# Patient Record
Sex: Female | Born: 1950 | Race: Black or African American | Hispanic: No | Marital: Married | Smoking: Never smoker
Health system: Southern US, Community
[De-identification: ages and names within clinical notes are randomized; demographics above are authoritative.]

## PROBLEM LIST (undated history)

## (undated) HISTORY — PX: BREAST BIOPSY: SHX20

---

## 2015-07-22 ENCOUNTER — Other Ambulatory Visit: Payer: Self-pay | Admitting: Nephrology

## 2015-07-22 DIAGNOSIS — N183 Chronic kidney disease, stage 3 unspecified: Secondary | ICD-10-CM

## 2015-07-25 ENCOUNTER — Other Ambulatory Visit: Payer: Self-pay

## 2015-08-08 ENCOUNTER — Ambulatory Visit
Admission: RE | Admit: 2015-08-08 | Discharge: 2015-08-08 | Disposition: A | Discharge status: Home / Self Care | Payer: BC Managed Care – PPO | Source: Ambulatory Visit | Attending: Nephrology | Admitting: Nephrology

## 2015-08-08 DIAGNOSIS — N183 Chronic kidney disease, stage 3 unspecified: Secondary | ICD-10-CM

## 2015-08-19 ENCOUNTER — Other Ambulatory Visit: Payer: Self-pay | Admitting: Nurse Practitioner

## 2015-08-19 DIAGNOSIS — N644 Mastodynia: Secondary | ICD-10-CM

## 2015-08-29 ENCOUNTER — Other Ambulatory Visit: Payer: BC Managed Care – PPO

## 2015-09-15 ENCOUNTER — Other Ambulatory Visit: Payer: BC Managed Care – PPO

## 2015-09-15 ENCOUNTER — Inpatient Hospital Stay: Admission: RE | Admit: 2015-09-15 | Payer: BC Managed Care – PPO | Source: Ambulatory Visit

## 2016-04-08 ENCOUNTER — Other Ambulatory Visit: Payer: Self-pay | Admitting: Family Medicine

## 2016-04-08 DIAGNOSIS — Z1231 Encounter for screening mammogram for malignant neoplasm of breast: Secondary | ICD-10-CM

## 2016-04-30 ENCOUNTER — Ambulatory Visit: Payer: BC Managed Care – PPO

## 2016-06-04 ENCOUNTER — Ambulatory Visit
Admission: RE | Admit: 2016-06-04 | Discharge: 2016-06-04 | Disposition: A | Discharge status: Home / Self Care | Payer: BC Managed Care – PPO | Source: Ambulatory Visit | Attending: Family Medicine | Admitting: Family Medicine

## 2016-06-04 DIAGNOSIS — Z1231 Encounter for screening mammogram for malignant neoplasm of breast: Secondary | ICD-10-CM

## 2016-09-21 DIAGNOSIS — M545 Low back pain: Secondary | ICD-10-CM | POA: Diagnosis not present

## 2016-09-21 DIAGNOSIS — N183 Chronic kidney disease, stage 3 (moderate): Secondary | ICD-10-CM | POA: Diagnosis not present

## 2016-09-21 DIAGNOSIS — I129 Hypertensive chronic kidney disease with stage 1 through stage 4 chronic kidney disease, or unspecified chronic kidney disease: Secondary | ICD-10-CM | POA: Diagnosis not present

## 2016-09-21 DIAGNOSIS — D696 Thrombocytopenia, unspecified: Secondary | ICD-10-CM | POA: Diagnosis not present

## 2016-09-21 DIAGNOSIS — E7800 Pure hypercholesterolemia, unspecified: Secondary | ICD-10-CM | POA: Diagnosis not present

## 2016-09-21 DIAGNOSIS — E669 Obesity, unspecified: Secondary | ICD-10-CM | POA: Diagnosis not present

## 2017-02-02 DIAGNOSIS — Z23 Encounter for immunization: Secondary | ICD-10-CM | POA: Diagnosis not present

## 2017-03-14 DIAGNOSIS — Z7189 Other specified counseling: Secondary | ICD-10-CM | POA: Diagnosis not present

## 2017-03-14 DIAGNOSIS — Z136 Encounter for screening for cardiovascular disorders: Secondary | ICD-10-CM | POA: Diagnosis not present

## 2017-03-14 DIAGNOSIS — Z1231 Encounter for screening mammogram for malignant neoplasm of breast: Secondary | ICD-10-CM | POA: Diagnosis not present

## 2017-03-14 DIAGNOSIS — Z6829 Body mass index (BMI) 29.0-29.9, adult: Secondary | ICD-10-CM | POA: Diagnosis not present

## 2017-03-14 DIAGNOSIS — E663 Overweight: Secondary | ICD-10-CM | POA: Diagnosis not present

## 2017-03-14 DIAGNOSIS — Z0000 Encounter for general adult medical examination without abnormal findings: Secondary | ICD-10-CM | POA: Diagnosis not present

## 2017-03-14 DIAGNOSIS — R9431 Abnormal electrocardiogram [ECG] [EKG]: Secondary | ICD-10-CM | POA: Diagnosis not present

## 2017-03-14 DIAGNOSIS — D696 Thrombocytopenia, unspecified: Secondary | ICD-10-CM | POA: Diagnosis not present

## 2017-03-14 DIAGNOSIS — Z1211 Encounter for screening for malignant neoplasm of colon: Secondary | ICD-10-CM | POA: Diagnosis not present

## 2017-06-14 DIAGNOSIS — Z6829 Body mass index (BMI) 29.0-29.9, adult: Secondary | ICD-10-CM | POA: Diagnosis not present

## 2017-06-14 DIAGNOSIS — I10 Essential (primary) hypertension: Secondary | ICD-10-CM | POA: Diagnosis not present

## 2017-06-14 DIAGNOSIS — Z9181 History of falling: Secondary | ICD-10-CM | POA: Diagnosis not present

## 2017-06-14 DIAGNOSIS — Z1331 Encounter for screening for depression: Secondary | ICD-10-CM | POA: Diagnosis not present

## 2017-06-14 DIAGNOSIS — L84 Corns and callosities: Secondary | ICD-10-CM | POA: Diagnosis not present

## 2017-06-14 DIAGNOSIS — M25511 Pain in right shoulder: Secondary | ICD-10-CM | POA: Diagnosis not present

## 2017-09-27 DIAGNOSIS — H2513 Age-related nuclear cataract, bilateral: Secondary | ICD-10-CM | POA: Diagnosis not present

## 2017-10-25 DIAGNOSIS — E669 Obesity, unspecified: Secondary | ICD-10-CM | POA: Diagnosis not present

## 2017-10-25 DIAGNOSIS — N183 Chronic kidney disease, stage 3 (moderate): Secondary | ICD-10-CM | POA: Diagnosis not present

## 2017-10-25 DIAGNOSIS — E7800 Pure hypercholesterolemia, unspecified: Secondary | ICD-10-CM | POA: Diagnosis not present

## 2017-10-25 DIAGNOSIS — I129 Hypertensive chronic kidney disease with stage 1 through stage 4 chronic kidney disease, or unspecified chronic kidney disease: Secondary | ICD-10-CM | POA: Diagnosis not present

## 2018-02-02 DIAGNOSIS — Z23 Encounter for immunization: Secondary | ICD-10-CM | POA: Diagnosis not present

## 2018-02-07 DIAGNOSIS — Z111 Encounter for screening for respiratory tuberculosis: Secondary | ICD-10-CM | POA: Diagnosis not present

## 2018-03-20 DIAGNOSIS — Z1239 Encounter for other screening for malignant neoplasm of breast: Secondary | ICD-10-CM | POA: Diagnosis not present

## 2018-03-20 DIAGNOSIS — M79675 Pain in left toe(s): Secondary | ICD-10-CM | POA: Diagnosis not present

## 2018-03-20 DIAGNOSIS — Z9181 History of falling: Secondary | ICD-10-CM | POA: Diagnosis not present

## 2018-03-20 DIAGNOSIS — Z136 Encounter for screening for cardiovascular disorders: Secondary | ICD-10-CM | POA: Diagnosis not present

## 2018-03-20 DIAGNOSIS — Z0000 Encounter for general adult medical examination without abnormal findings: Secondary | ICD-10-CM | POA: Diagnosis not present

## 2018-03-20 DIAGNOSIS — R202 Paresthesia of skin: Secondary | ICD-10-CM | POA: Diagnosis not present

## 2018-03-20 DIAGNOSIS — Z1339 Encounter for screening examination for other mental health and behavioral disorders: Secondary | ICD-10-CM | POA: Diagnosis not present

## 2018-03-20 DIAGNOSIS — Z139 Encounter for screening, unspecified: Secondary | ICD-10-CM | POA: Diagnosis not present

## 2018-03-20 DIAGNOSIS — E785 Hyperlipidemia, unspecified: Secondary | ICD-10-CM | POA: Diagnosis not present

## 2018-03-20 DIAGNOSIS — D696 Thrombocytopenia, unspecified: Secondary | ICD-10-CM | POA: Diagnosis not present

## 2018-03-22 ENCOUNTER — Other Ambulatory Visit: Payer: Self-pay | Admitting: Nurse Practitioner

## 2018-03-22 DIAGNOSIS — Z1231 Encounter for screening mammogram for malignant neoplasm of breast: Secondary | ICD-10-CM

## 2018-05-08 ENCOUNTER — Encounter: Payer: Self-pay | Admitting: Radiology

## 2018-05-08 ENCOUNTER — Ambulatory Visit
Admission: RE | Admit: 2018-05-08 | Discharge: 2018-05-08 | Disposition: A | Discharge status: Home / Self Care | Payer: BC Managed Care – PPO | Source: Ambulatory Visit | Attending: Nurse Practitioner | Admitting: Nurse Practitioner

## 2018-05-08 DIAGNOSIS — Z1231 Encounter for screening mammogram for malignant neoplasm of breast: Secondary | ICD-10-CM

## 2018-10-13 DIAGNOSIS — I129 Hypertensive chronic kidney disease with stage 1 through stage 4 chronic kidney disease, or unspecified chronic kidney disease: Secondary | ICD-10-CM | POA: Diagnosis not present

## 2018-10-13 DIAGNOSIS — E7800 Pure hypercholesterolemia, unspecified: Secondary | ICD-10-CM | POA: Diagnosis not present

## 2018-10-13 DIAGNOSIS — D696 Thrombocytopenia, unspecified: Secondary | ICD-10-CM | POA: Diagnosis not present

## 2018-10-13 DIAGNOSIS — M545 Low back pain: Secondary | ICD-10-CM | POA: Diagnosis not present

## 2018-10-13 DIAGNOSIS — Z0000 Encounter for general adult medical examination without abnormal findings: Secondary | ICD-10-CM | POA: Diagnosis not present

## 2018-10-13 DIAGNOSIS — N183 Chronic kidney disease, stage 3 (moderate): Secondary | ICD-10-CM | POA: Diagnosis not present

## 2018-10-13 DIAGNOSIS — E669 Obesity, unspecified: Secondary | ICD-10-CM | POA: Diagnosis not present

## 2018-10-18 DIAGNOSIS — D3102 Benign neoplasm of left conjunctiva: Secondary | ICD-10-CM | POA: Diagnosis not present

## 2018-10-18 DIAGNOSIS — H2513 Age-related nuclear cataract, bilateral: Secondary | ICD-10-CM | POA: Diagnosis not present

## 2018-12-04 ENCOUNTER — Encounter (HOSPITAL_COMMUNITY): Payer: Self-pay | Admitting: Obstetrics and Gynecology

## 2018-12-04 ENCOUNTER — Emergency Department (HOSPITAL_COMMUNITY)
Admission: EM | Admit: 2018-12-04 | Discharge: 2018-12-04 | Disposition: A | Discharge status: Home / Self Care | Payer: Medicare HMO | Attending: Emergency Medicine | Admitting: Emergency Medicine

## 2018-12-04 ENCOUNTER — Other Ambulatory Visit: Payer: Self-pay

## 2018-12-04 DIAGNOSIS — Z79899 Other long term (current) drug therapy: Secondary | ICD-10-CM | POA: Insufficient documentation

## 2018-12-04 DIAGNOSIS — M5442 Lumbago with sciatica, left side: Secondary | ICD-10-CM | POA: Diagnosis not present

## 2018-12-04 DIAGNOSIS — R52 Pain, unspecified: Secondary | ICD-10-CM | POA: Diagnosis not present

## 2018-12-04 DIAGNOSIS — R29898 Other symptoms and signs involving the musculoskeletal system: Secondary | ICD-10-CM | POA: Diagnosis not present

## 2018-12-04 DIAGNOSIS — I10 Essential (primary) hypertension: Secondary | ICD-10-CM | POA: Diagnosis not present

## 2018-12-04 DIAGNOSIS — M79605 Pain in left leg: Secondary | ICD-10-CM | POA: Diagnosis not present

## 2018-12-04 DIAGNOSIS — M5432 Sciatica, left side: Secondary | ICD-10-CM

## 2018-12-04 MED ORDER — HYDROMORPHONE HCL 1 MG/ML IJ SOLN
1.0000 mg | Freq: Once | INTRAMUSCULAR | Status: AC
  Administered 2018-12-04: 09:00:00 1 mg via INTRAMUSCULAR
  Filled 2018-12-04: qty 1

## 2018-12-04 MED ORDER — KETOROLAC TROMETHAMINE 60 MG/2ML IM SOLN
15.0000 mg | Freq: Once | INTRAMUSCULAR | Status: AC
  Administered 2018-12-04: 15 mg via INTRAMUSCULAR
  Filled 2018-12-04: qty 2

## 2018-12-04 MED ORDER — DEXAMETHASONE SODIUM PHOSPHATE 10 MG/ML IJ SOLN
10.0000 mg | Freq: Once | INTRAMUSCULAR | Status: AC
  Administered 2018-12-04: 10 mg via INTRAMUSCULAR
  Filled 2018-12-04: qty 1

## 2018-12-04 MED ORDER — METHYLPREDNISOLONE 4 MG PO TBPK: ORAL_TABLET | ORAL | 0 refills | Status: AC

## 2018-12-04 MED ORDER — HYDROCODONE-ACETAMINOPHEN 5-325 MG PO TABS: 1.0000 | ORAL_TABLET | Freq: Four times a day (QID) | ORAL | 0 refills | Status: AC | PRN

## 2018-12-04 NOTE — ED Notes (Signed)
Patient ambulated to the bathroom with assistance. Pt reports pain during ambulation but is able to ambulate. RN explained the medications that are to be given and their functions, as well as the understanding that her leg will not fell 100% after the medication, as the sciatic nerve likely will need time to have the inflammation reduced if this is indeed sciatica. Patient reports understanding of plan of care.  Pt is requesting her husband to come back to her room. Patient's husband reportedly left.

## 2018-12-04 NOTE — ED Triage Notes (Signed)
Pt reports pain in the left leg after walking on a treadmill. Pt reports hip to knee pain and it is painful to ambulate. Pt reports she cannot walk due to the pain. No reported trauma or other injury to leg.  Pt's VSS.

## 2018-12-04 NOTE — ED Notes (Signed)
Patient visiting starts at noon today in the ED. Patient is aware and reports understanding. Pt has spoken to husband using the hospital phone and is able to update her contacts.  Pt has the call bell at bedside and is in no active distress at this time.  Pt reports her pain is an 8 at this time and the pain is starting to ease up.

## 2018-12-04 NOTE — Discharge Instructions (Addendum)
1.  Follow-up with your doctor for recheck in the next 3 to 5 days. 2.  Return to the emergency department if you develop weakness or numbness of the leg. 3.  Start your Medrol Dosepak day after tomorrow.  Take Vicodin 1 to 2 tablets every 6 hours as needed for pain control. 4.  If you have any tendency towards constipation, take a stool softener twice a day such as Colace to avoid getting constipated while taking narcotic pain medications (Vicodin).

## 2018-12-04 NOTE — ED Provider Notes (Signed)
[facility] DEPT Provider Note   CSN: [id number] Arrival date & time: 12/04/18  0807    History   Chief Complaint Chief Complaint  Patient presents with  . Leg Pain    HPI Cynthia Wilkerson is a 68 y.o. female.     HPI Patient's are working out on a treadmill 3 days ago.  She reports that she had some tightness and soreness in her left hip after working out.  She reports that that is really progressed and this morning it is a severe sharp pain that goes to her hip and upper thigh.  She reports that it hurts so badly is hard for her to walk.  She denies that the leg is numb or tingly.  She denies of the leg is actually weak.  She has a lot of pain if he tries to twisted her back or bend at the hip.  No bowel or bladder dysfunction.  No prior history of significant back problems.  No abdominal pain. No past medical history on file.  There are no active problems to display for this patient.      OB History    Gravida      Para      Term      Preterm      AB      Living  2     SAB      TAB      Ectopic      Multiple      Live Births               Home Medications    Prior to Admission medications   Medication Sig Start Date End Date Taking? Authorizing Provider  acetaminophen (TYLENOL) 325 MG tablet Take 325 mg by mouth every 6 (six) hours as needed for mild pain or headache.   Yes [provider]  trolamine salicylate (ASPERCREME) 10 % cream Apply 1 application topically as needed for muscle pain.   Yes [provider]  HYDROcodone-acetaminophen (NORCO/VICODIN) 5-325 MG tablet Take 1-2 tablets by mouth every 6 (six) hours as needed for moderate pain or severe pain. 12/04/18   Charlesetta Shanks, MD  methylPREDNISolone (MEDROL DOSEPAK) 4 MG TBPK tablet Start dose pack per instructions 7/29. 12/04/18   Charlesetta Shanks, MD    Family History Family History  Problem Relation Age of Onset  . Breast cancer Neg Hx      Social History Social History   Tobacco Use  . Smoking status: Never Smoker  Substance Use Topics  . Alcohol use: Not Currently  . Drug use: Not Currently     Allergies   Codeine   Review of Systems Review of Systems 10 Systems reviewed and are negative for acute change except as noted in the HPI.  Physical Exam Updated Vital Signs BP (!) 168/96   Pulse 70   Temp 98.4 F (36.9 C) (Oral)   Resp 16   SpO2 99%   Physical Exam Constitutional:      Appearance: Normal appearance.     Comments: Alert and nontoxic.  Clinically well in appearance.  Patient is uncomfortable and in pain.  Cardiovascular:     Rate and Rhythm: Normal rate and regular rhythm.  Pulmonary:     Effort: Pulmonary effort is normal.     Breath sounds: Normal breath sounds.  Abdominal:     General: There is no distension.     Palpations: Abdomen is soft.  Tenderness: There is no abdominal tenderness. There is no guarding.  Musculoskeletal:     Comments: Patient has pain that is in her hip and lower back when she tries to roll in the stretcher or raise her hips.  Reproducible pain at the left SI joint.  Normal skin and soft tissue palpation of the hip and back.  No peripheral edema no joint effusions.  Dorsalis pedis pulses 2+ symmetric.  The feet and legs are warm and dry.  No peripheral edema calves are soft and nontender.  Good condition of lower extremities.  Skin:    General: Skin is warm and dry.  Neurological:     Comments: Normal intact strength for plantar flexion dorsiflexion bilaterally.  Patient can use both lower extremities to raise her hips from the bed to assist in undressing.  This is painful for her but no weakness.      ED Treatments / Results  Labs (all labs ordered are listed, but only abnormal results are displayed) Labs Reviewed - No data to display  EKG None  Radiology No results found.  Procedures Procedures (including critical care time)  Medications  Ordered in ED Medications  dexamethasone (DECADRON) injection 10 mg (10 mg Intramuscular Given 12/04/18 0906)  HYDROmorphone (DILAUDID) injection 1 mg (1 mg Intramuscular Given 12/04/18 0906)  ketorolac (TORADOL) injection 15 mg (15 mg Intramuscular Given 12/04/18 0906)     Initial Impression / Assessment and Plan / ED Course  I have reviewed the triage vital signs and the nursing notes.  Pertinent labs & imaging results that were available during my care of the patient were reviewed by me and considered in my medical decision making (see chart for details).       Patient onset of pain after exercising on a treadmill.  Pain follows a classic sciatic pattern with pain localizing to the SI joint and then radiating down to the hip and the thigh.  Patient has no neurologic dysfunction.  She is in a lot of pain but significantly improved after administration of Toradol (15mg ), Decadron and Dilaudid.  Will have patient start Medrol Dosepak day after tomorrow and hydrocodone as needed for additional pain control.  He is counseled to follow-up with PCP within the next 3 to 5 days.  Final Clinical Impressions(s) / ED Diagnoses   Final diagnoses:  Sciatica of left side    ED Discharge Orders         Ordered    methylPREDNISolone (MEDROL DOSEPAK) 4 MG TBPK tablet     12/04/18 1044    HYDROcodone-acetaminophen (NORCO/VICODIN) 5-325 MG tablet  Every 6 hours PRN     12/04/18 1044           Charlesetta Shanks, MD 12/04/18 1048

## 2018-12-22 DIAGNOSIS — M5430 Sciatica, unspecified side: Secondary | ICD-10-CM | POA: Diagnosis not present

## 2018-12-22 DIAGNOSIS — Z6829 Body mass index (BMI) 29.0-29.9, adult: Secondary | ICD-10-CM | POA: Diagnosis not present

## 2018-12-22 DIAGNOSIS — Z1211 Encounter for screening for malignant neoplasm of colon: Secondary | ICD-10-CM | POA: Diagnosis not present

## 2019-02-07 DIAGNOSIS — Z111 Encounter for screening for respiratory tuberculosis: Secondary | ICD-10-CM | POA: Diagnosis not present

## 2019-02-16 DIAGNOSIS — Z23 Encounter for immunization: Secondary | ICD-10-CM | POA: Diagnosis not present

## 2019-03-26 DIAGNOSIS — Z1331 Encounter for screening for depression: Secondary | ICD-10-CM | POA: Diagnosis not present

## 2019-03-26 DIAGNOSIS — Z9181 History of falling: Secondary | ICD-10-CM | POA: Diagnosis not present

## 2019-03-26 DIAGNOSIS — E785 Hyperlipidemia, unspecified: Secondary | ICD-10-CM | POA: Diagnosis not present

## 2019-03-26 DIAGNOSIS — Z139 Encounter for screening, unspecified: Secondary | ICD-10-CM | POA: Diagnosis not present

## 2019-03-26 DIAGNOSIS — Z0000 Encounter for general adult medical examination without abnormal findings: Secondary | ICD-10-CM | POA: Diagnosis not present

## 2019-11-28 DIAGNOSIS — H43393 Other vitreous opacities, bilateral: Secondary | ICD-10-CM | POA: Diagnosis not present

## 2019-11-28 DIAGNOSIS — H2513 Age-related nuclear cataract, bilateral: Secondary | ICD-10-CM | POA: Diagnosis not present

## 2019-11-28 DIAGNOSIS — D3102 Benign neoplasm of left conjunctiva: Secondary | ICD-10-CM | POA: Diagnosis not present

## 2019-12-19 DIAGNOSIS — Z0000 Encounter for general adult medical examination without abnormal findings: Secondary | ICD-10-CM | POA: Diagnosis not present

## 2019-12-19 DIAGNOSIS — N189 Chronic kidney disease, unspecified: Secondary | ICD-10-CM | POA: Diagnosis not present

## 2019-12-19 DIAGNOSIS — D696 Thrombocytopenia, unspecified: Secondary | ICD-10-CM | POA: Diagnosis not present

## 2019-12-19 DIAGNOSIS — I129 Hypertensive chronic kidney disease with stage 1 through stage 4 chronic kidney disease, or unspecified chronic kidney disease: Secondary | ICD-10-CM | POA: Diagnosis not present

## 2019-12-19 DIAGNOSIS — E7800 Pure hypercholesterolemia, unspecified: Secondary | ICD-10-CM | POA: Diagnosis not present

## 2019-12-19 DIAGNOSIS — E669 Obesity, unspecified: Secondary | ICD-10-CM | POA: Diagnosis not present

## 2019-12-19 DIAGNOSIS — N183 Chronic kidney disease, stage 3 unspecified: Secondary | ICD-10-CM | POA: Diagnosis not present

## 2020-03-06 DIAGNOSIS — Z23 Encounter for immunization: Secondary | ICD-10-CM | POA: Diagnosis not present

## 2020-03-27 DIAGNOSIS — Z9181 History of falling: Secondary | ICD-10-CM | POA: Diagnosis not present

## 2020-03-27 DIAGNOSIS — Z0000 Encounter for general adult medical examination without abnormal findings: Secondary | ICD-10-CM | POA: Diagnosis not present

## 2020-03-27 DIAGNOSIS — E785 Hyperlipidemia, unspecified: Secondary | ICD-10-CM | POA: Diagnosis not present

## 2020-03-27 DIAGNOSIS — Z1331 Encounter for screening for depression: Secondary | ICD-10-CM | POA: Diagnosis not present

## 2020-03-27 DIAGNOSIS — Z139 Encounter for screening, unspecified: Secondary | ICD-10-CM | POA: Diagnosis not present

## 2020-04-24 ENCOUNTER — Other Ambulatory Visit: Payer: Self-pay | Admitting: Physician Assistant

## 2020-04-24 DIAGNOSIS — Z1231 Encounter for screening mammogram for malignant neoplasm of breast: Secondary | ICD-10-CM

## 2020-04-24 DIAGNOSIS — E2839 Other primary ovarian failure: Secondary | ICD-10-CM

## 2020-08-19 ENCOUNTER — Other Ambulatory Visit: Payer: Self-pay

## 2020-08-19 ENCOUNTER — Ambulatory Visit
Admission: RE | Admit: 2020-08-19 | Discharge: 2020-08-19 | Disposition: A | Discharge status: Home / Self Care | Payer: Medicare HMO | Source: Ambulatory Visit | Attending: Physician Assistant | Admitting: Physician Assistant

## 2020-08-19 DIAGNOSIS — Z1231 Encounter for screening mammogram for malignant neoplasm of breast: Secondary | ICD-10-CM | POA: Diagnosis not present

## 2020-08-26 ENCOUNTER — Other Ambulatory Visit: Payer: Self-pay

## 2020-08-26 ENCOUNTER — Ambulatory Visit
Admission: RE | Admit: 2020-08-26 | Discharge: 2020-08-26 | Disposition: A | Discharge status: Home / Self Care | Payer: BC Managed Care – PPO | Source: Ambulatory Visit | Attending: Physician Assistant | Admitting: Physician Assistant

## 2020-08-26 DIAGNOSIS — Z780 Asymptomatic menopausal state: Secondary | ICD-10-CM | POA: Diagnosis not present

## 2020-08-26 DIAGNOSIS — M8589 Other specified disorders of bone density and structure, multiple sites: Secondary | ICD-10-CM | POA: Diagnosis not present

## 2020-08-26 DIAGNOSIS — E2839 Other primary ovarian failure: Secondary | ICD-10-CM

## 2020-09-29 DIAGNOSIS — M8580 Other specified disorders of bone density and structure, unspecified site: Secondary | ICD-10-CM | POA: Diagnosis not present

## 2020-09-29 DIAGNOSIS — N183 Chronic kidney disease, stage 3 unspecified: Secondary | ICD-10-CM | POA: Diagnosis not present

## 2020-09-29 DIAGNOSIS — E7800 Pure hypercholesterolemia, unspecified: Secondary | ICD-10-CM | POA: Diagnosis not present

## 2020-09-29 DIAGNOSIS — D696 Thrombocytopenia, unspecified: Secondary | ICD-10-CM | POA: Diagnosis not present

## 2020-09-29 DIAGNOSIS — Z6830 Body mass index (BMI) 30.0-30.9, adult: Secondary | ICD-10-CM | POA: Diagnosis not present

## 2020-12-30 DIAGNOSIS — N189 Chronic kidney disease, unspecified: Secondary | ICD-10-CM | POA: Diagnosis not present

## 2020-12-30 DIAGNOSIS — I129 Hypertensive chronic kidney disease with stage 1 through stage 4 chronic kidney disease, or unspecified chronic kidney disease: Secondary | ICD-10-CM | POA: Diagnosis not present

## 2020-12-30 DIAGNOSIS — M545 Low back pain, unspecified: Secondary | ICD-10-CM | POA: Diagnosis not present

## 2020-12-30 DIAGNOSIS — N183 Chronic kidney disease, stage 3 unspecified: Secondary | ICD-10-CM | POA: Diagnosis not present

## 2021-01-13 DIAGNOSIS — H6092 Unspecified otitis externa, left ear: Secondary | ICD-10-CM | POA: Diagnosis not present

## 2021-01-13 DIAGNOSIS — Z6830 Body mass index (BMI) 30.0-30.9, adult: Secondary | ICD-10-CM | POA: Diagnosis not present

## 2021-02-05 DIAGNOSIS — H6503 Acute serous otitis media, bilateral: Secondary | ICD-10-CM | POA: Diagnosis not present

## 2021-02-25 DIAGNOSIS — R030 Elevated blood-pressure reading, without diagnosis of hypertension: Secondary | ICD-10-CM | POA: Diagnosis not present

## 2021-02-25 DIAGNOSIS — H6502 Acute serous otitis media, left ear: Secondary | ICD-10-CM | POA: Diagnosis not present

## 2021-03-04 DIAGNOSIS — H2513 Age-related nuclear cataract, bilateral: Secondary | ICD-10-CM | POA: Diagnosis not present

## 2021-03-04 DIAGNOSIS — H43391 Other vitreous opacities, right eye: Secondary | ICD-10-CM | POA: Diagnosis not present

## 2021-03-04 DIAGNOSIS — D3102 Benign neoplasm of left conjunctiva: Secondary | ICD-10-CM | POA: Diagnosis not present

## 2021-06-18 DIAGNOSIS — L659 Nonscarring hair loss, unspecified: Secondary | ICD-10-CM | POA: Diagnosis not present

## 2021-06-18 DIAGNOSIS — L669 Cicatricial alopecia, unspecified: Secondary | ICD-10-CM | POA: Diagnosis not present

## 2021-07-10 ENCOUNTER — Other Ambulatory Visit: Payer: Self-pay | Admitting: Family Medicine

## 2021-07-10 DIAGNOSIS — Z1231 Encounter for screening mammogram for malignant neoplasm of breast: Secondary | ICD-10-CM

## 2021-08-21 ENCOUNTER — Ambulatory Visit
Admission: RE | Admit: 2021-08-21 | Discharge: 2021-08-21 | Disposition: A | Discharge status: Home / Self Care | Payer: Medicare HMO | Source: Ambulatory Visit | Attending: Family Medicine | Admitting: Family Medicine

## 2021-08-21 DIAGNOSIS — Z1231 Encounter for screening mammogram for malignant neoplasm of breast: Secondary | ICD-10-CM | POA: Diagnosis not present

## 2021-08-24 ENCOUNTER — Other Ambulatory Visit: Payer: Self-pay | Admitting: Family Medicine

## 2021-08-24 DIAGNOSIS — R928 Other abnormal and inconclusive findings on diagnostic imaging of breast: Secondary | ICD-10-CM

## 2021-09-04 ENCOUNTER — Other Ambulatory Visit: Payer: Self-pay | Admitting: Physician Assistant

## 2021-09-07 ENCOUNTER — Ambulatory Visit
Admission: RE | Admit: 2021-09-07 | Discharge: 2021-09-07 | Disposition: A | Discharge status: Home / Self Care | Payer: Medicare HMO | Source: Ambulatory Visit | Attending: Family Medicine | Admitting: Family Medicine

## 2021-09-07 ENCOUNTER — Other Ambulatory Visit: Payer: Self-pay | Admitting: Family Medicine

## 2021-09-07 DIAGNOSIS — R928 Other abnormal and inconclusive findings on diagnostic imaging of breast: Secondary | ICD-10-CM

## 2021-09-07 DIAGNOSIS — N6311 Unspecified lump in the right breast, upper outer quadrant: Secondary | ICD-10-CM | POA: Diagnosis not present

## 2021-09-10 ENCOUNTER — Ambulatory Visit
Admission: RE | Admit: 2021-09-10 | Discharge: 2021-09-10 | Disposition: A | Discharge status: Home / Self Care | Payer: Medicare HMO | Source: Ambulatory Visit | Attending: Family Medicine | Admitting: Family Medicine

## 2021-09-10 DIAGNOSIS — R928 Other abnormal and inconclusive findings on diagnostic imaging of breast: Secondary | ICD-10-CM

## 2021-09-10 DIAGNOSIS — N6011 Diffuse cystic mastopathy of right breast: Secondary | ICD-10-CM | POA: Diagnosis not present

## 2021-09-10 HISTORY — PX: BREAST BIOPSY: SHX20

## 2021-09-28 DIAGNOSIS — E7800 Pure hypercholesterolemia, unspecified: Secondary | ICD-10-CM | POA: Diagnosis not present

## 2021-09-28 DIAGNOSIS — D696 Thrombocytopenia, unspecified: Secondary | ICD-10-CM | POA: Diagnosis not present

## 2021-09-28 DIAGNOSIS — M8580 Other specified disorders of bone density and structure, unspecified site: Secondary | ICD-10-CM | POA: Diagnosis not present

## 2021-09-28 DIAGNOSIS — Z9181 History of falling: Secondary | ICD-10-CM | POA: Diagnosis not present

## 2021-09-28 DIAGNOSIS — Z0000 Encounter for general adult medical examination without abnormal findings: Secondary | ICD-10-CM | POA: Diagnosis not present

## 2021-09-28 DIAGNOSIS — Z1331 Encounter for screening for depression: Secondary | ICD-10-CM | POA: Diagnosis not present

## 2021-09-28 DIAGNOSIS — Z139 Encounter for screening, unspecified: Secondary | ICD-10-CM | POA: Diagnosis not present

## 2021-09-28 DIAGNOSIS — N1831 Chronic kidney disease, stage 3a: Secondary | ICD-10-CM | POA: Diagnosis not present

## 2021-09-28 DIAGNOSIS — N183 Chronic kidney disease, stage 3 unspecified: Secondary | ICD-10-CM | POA: Diagnosis not present

## 2022-01-12 DIAGNOSIS — D696 Thrombocytopenia, unspecified: Secondary | ICD-10-CM | POA: Diagnosis not present

## 2022-01-12 DIAGNOSIS — E7800 Pure hypercholesterolemia, unspecified: Secondary | ICD-10-CM | POA: Diagnosis not present

## 2022-01-12 DIAGNOSIS — N183 Chronic kidney disease, stage 3 unspecified: Secondary | ICD-10-CM | POA: Diagnosis not present

## 2022-01-12 DIAGNOSIS — Z0000 Encounter for general adult medical examination without abnormal findings: Secondary | ICD-10-CM | POA: Diagnosis not present

## 2022-01-12 DIAGNOSIS — E669 Obesity, unspecified: Secondary | ICD-10-CM | POA: Diagnosis not present

## 2022-01-12 DIAGNOSIS — I129 Hypertensive chronic kidney disease with stage 1 through stage 4 chronic kidney disease, or unspecified chronic kidney disease: Secondary | ICD-10-CM | POA: Diagnosis not present

## 2022-01-12 DIAGNOSIS — N189 Chronic kidney disease, unspecified: Secondary | ICD-10-CM | POA: Diagnosis not present

## 2022-03-31 DIAGNOSIS — H25813 Combined forms of age-related cataract, bilateral: Secondary | ICD-10-CM | POA: Diagnosis not present

## 2022-03-31 DIAGNOSIS — H43391 Other vitreous opacities, right eye: Secondary | ICD-10-CM | POA: Diagnosis not present

## 2022-03-31 DIAGNOSIS — D3102 Benign neoplasm of left conjunctiva: Secondary | ICD-10-CM | POA: Diagnosis not present

## 2022-07-07 DIAGNOSIS — M722 Plantar fascial fibromatosis: Secondary | ICD-10-CM | POA: Diagnosis not present

## 2022-07-07 DIAGNOSIS — S20219A Contusion of unspecified front wall of thorax, initial encounter: Secondary | ICD-10-CM | POA: Diagnosis not present

## 2022-07-14 ENCOUNTER — Other Ambulatory Visit: Payer: Self-pay | Admitting: Family Medicine

## 2022-07-14 DIAGNOSIS — Z1231 Encounter for screening mammogram for malignant neoplasm of breast: Secondary | ICD-10-CM

## 2022-08-31 ENCOUNTER — Ambulatory Visit
Admission: RE | Admit: 2022-08-31 | Discharge: 2022-08-31 | Disposition: A | Discharge status: Home / Self Care | Payer: Medicare HMO | Source: Ambulatory Visit | Attending: Family Medicine | Admitting: Family Medicine

## 2022-08-31 DIAGNOSIS — Z1231 Encounter for screening mammogram for malignant neoplasm of breast: Secondary | ICD-10-CM

## 2022-10-01 DIAGNOSIS — E785 Hyperlipidemia, unspecified: Secondary | ICD-10-CM | POA: Diagnosis not present

## 2022-10-01 DIAGNOSIS — N1831 Chronic kidney disease, stage 3a: Secondary | ICD-10-CM | POA: Diagnosis not present

## 2022-10-01 DIAGNOSIS — Z0000 Encounter for general adult medical examination without abnormal findings: Secondary | ICD-10-CM | POA: Diagnosis not present

## 2022-10-01 DIAGNOSIS — Z139 Encounter for screening, unspecified: Secondary | ICD-10-CM | POA: Diagnosis not present

## 2022-10-01 DIAGNOSIS — Z1331 Encounter for screening for depression: Secondary | ICD-10-CM | POA: Diagnosis not present

## 2022-10-01 DIAGNOSIS — M8580 Other specified disorders of bone density and structure, unspecified site: Secondary | ICD-10-CM | POA: Diagnosis not present

## 2022-10-01 DIAGNOSIS — Z1211 Encounter for screening for malignant neoplasm of colon: Secondary | ICD-10-CM | POA: Diagnosis not present

## 2022-10-01 DIAGNOSIS — E559 Vitamin D deficiency, unspecified: Secondary | ICD-10-CM | POA: Diagnosis not present

## 2022-10-01 DIAGNOSIS — Z9181 History of falling: Secondary | ICD-10-CM | POA: Diagnosis not present

## 2023-01-12 DIAGNOSIS — D696 Thrombocytopenia, unspecified: Secondary | ICD-10-CM | POA: Diagnosis not present

## 2023-01-12 DIAGNOSIS — I129 Hypertensive chronic kidney disease with stage 1 through stage 4 chronic kidney disease, or unspecified chronic kidney disease: Secondary | ICD-10-CM | POA: Diagnosis not present

## 2023-01-12 DIAGNOSIS — Z0000 Encounter for general adult medical examination without abnormal findings: Secondary | ICD-10-CM | POA: Diagnosis not present

## 2023-01-12 DIAGNOSIS — E669 Obesity, unspecified: Secondary | ICD-10-CM | POA: Diagnosis not present

## 2023-01-12 DIAGNOSIS — N183 Chronic kidney disease, stage 3 unspecified: Secondary | ICD-10-CM | POA: Diagnosis not present

## 2023-01-12 DIAGNOSIS — E7800 Pure hypercholesterolemia, unspecified: Secondary | ICD-10-CM | POA: Diagnosis not present

## 2023-03-10 DIAGNOSIS — Z9181 History of falling: Secondary | ICD-10-CM | POA: Diagnosis not present

## 2023-03-10 DIAGNOSIS — Z1331 Encounter for screening for depression: Secondary | ICD-10-CM | POA: Diagnosis not present

## 2023-03-10 DIAGNOSIS — Z139 Encounter for screening, unspecified: Secondary | ICD-10-CM | POA: Diagnosis not present

## 2023-03-10 DIAGNOSIS — Z0000 Encounter for general adult medical examination without abnormal findings: Secondary | ICD-10-CM | POA: Diagnosis not present

## 2023-03-10 DIAGNOSIS — Z1211 Encounter for screening for malignant neoplasm of colon: Secondary | ICD-10-CM | POA: Diagnosis not present

## 2023-04-04 DIAGNOSIS — D3142 Benign neoplasm of left ciliary body: Secondary | ICD-10-CM | POA: Diagnosis not present

## 2023-04-04 DIAGNOSIS — H25813 Combined forms of age-related cataract, bilateral: Secondary | ICD-10-CM | POA: Diagnosis not present

## 2023-07-20 DIAGNOSIS — B372 Candidiasis of skin and nail: Secondary | ICD-10-CM | POA: Diagnosis not present

## 2023-07-21 IMAGING — MG MM BREAST BX W/ LOC DEV 1ST LESION IMAGE BX SPEC STEREO GUIDE*R*
8 of 13 series · 8 of 37 positions shown · non-contrast
Comparison: None Available.
COMPARISON: None Available.

Addendum:
CLINICAL DATA: Patient is post stereotactic core needle biopsy of a
7 mm asymmetry over the posterior upper outer right breast.

EXAM:
RIGHT BREAST STEREOTACTIC CORE NEEDLE BIOPSY

[R (1 of 6)]
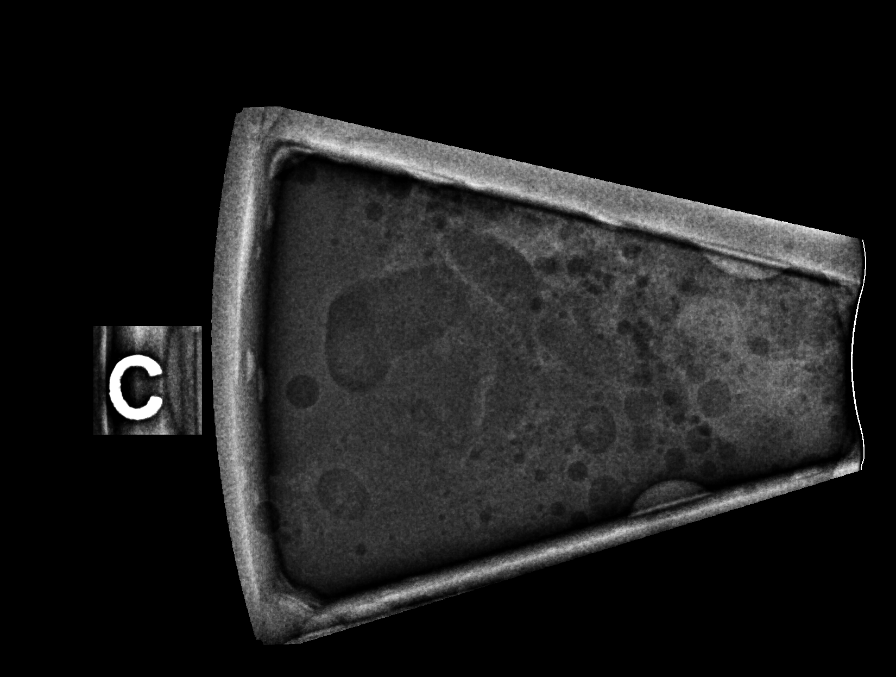

[R (2 of 6)]
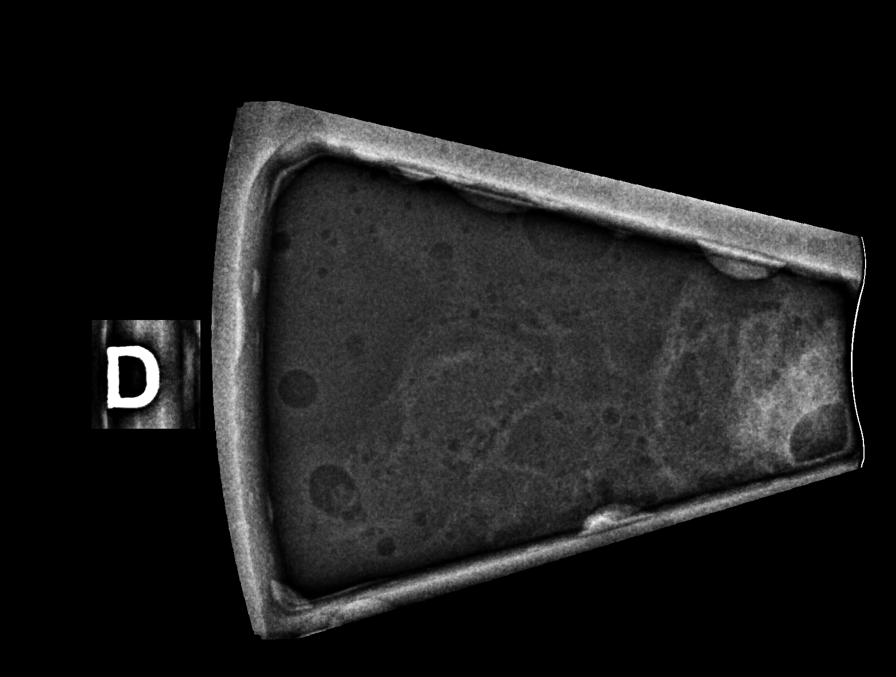

[R (3 of 6)]
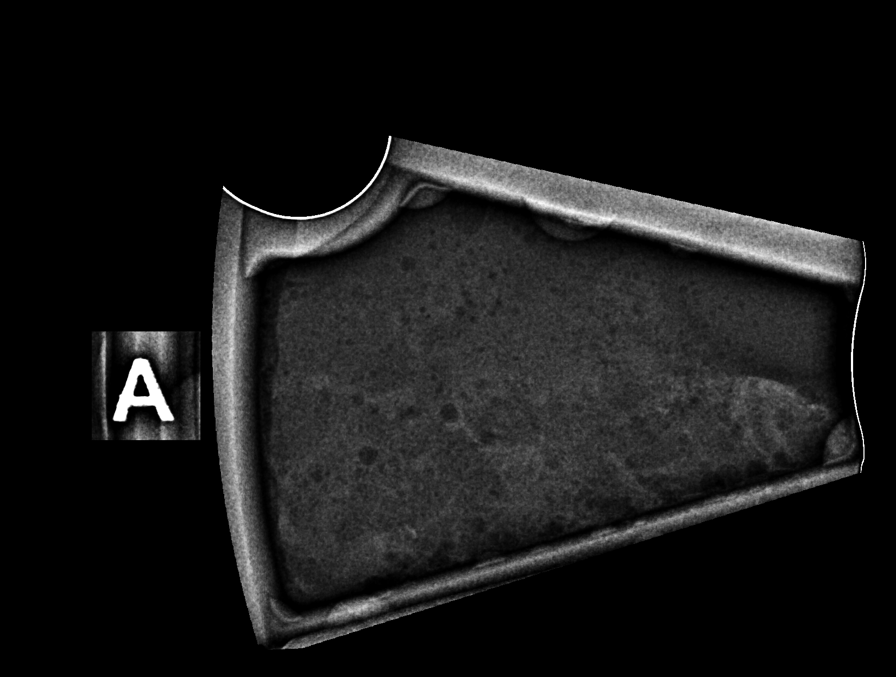

[R (4 of 6)]
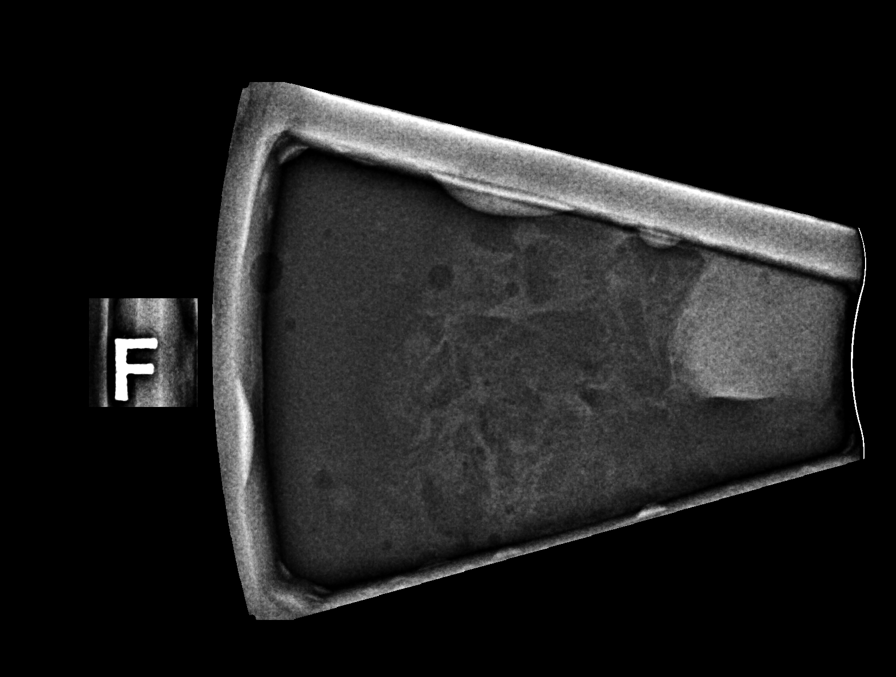

[R (5 of 6)]
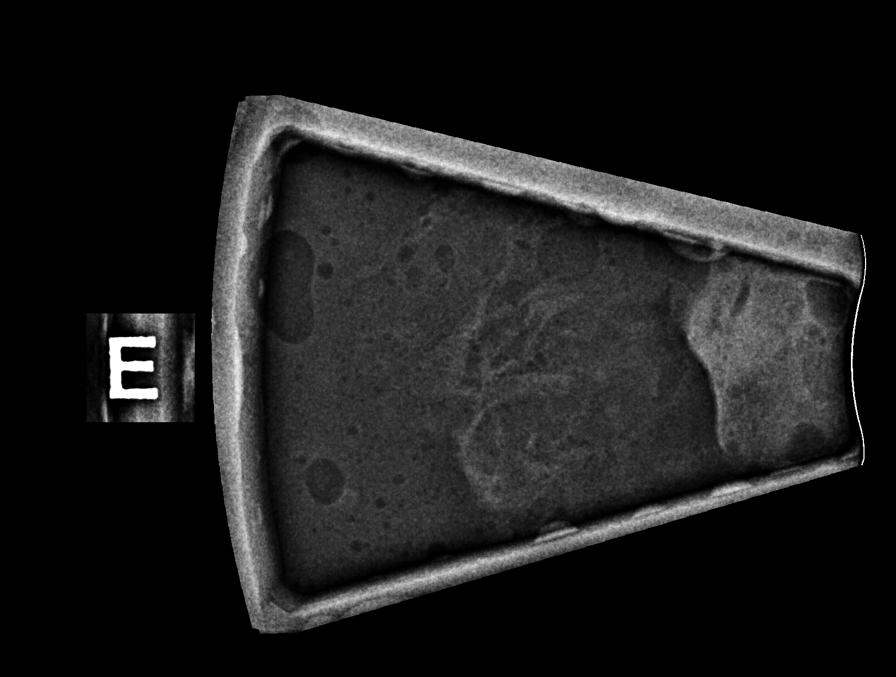

[R (6 of 6)]
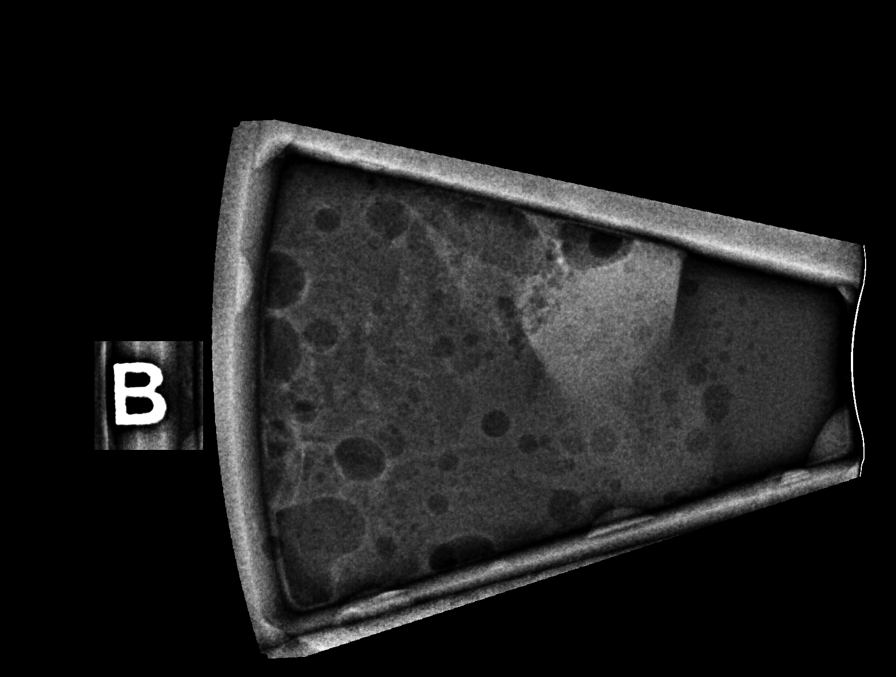

[R LM]
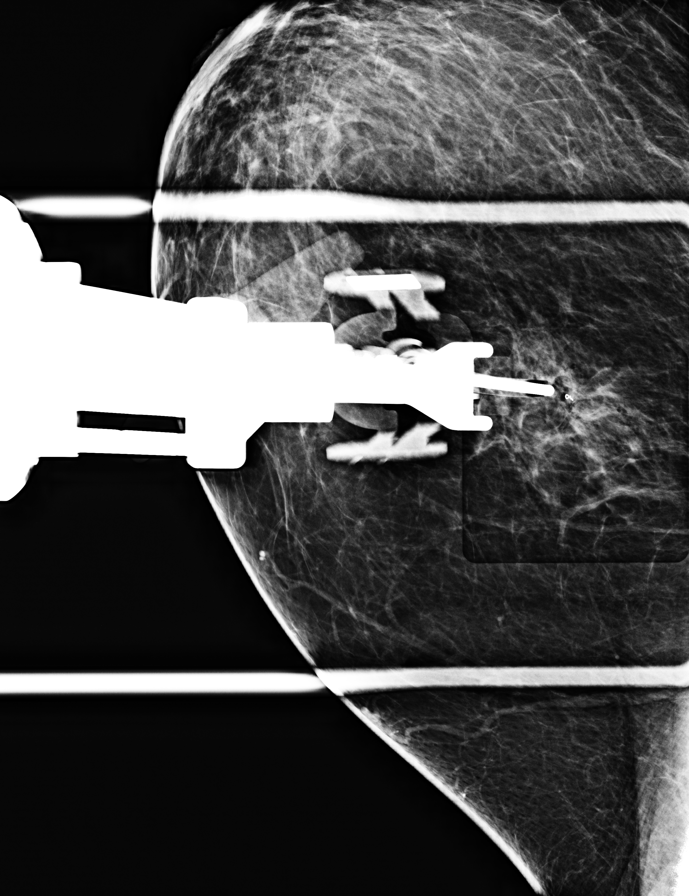

[R LM tomo · tomo slice 30/59.0]
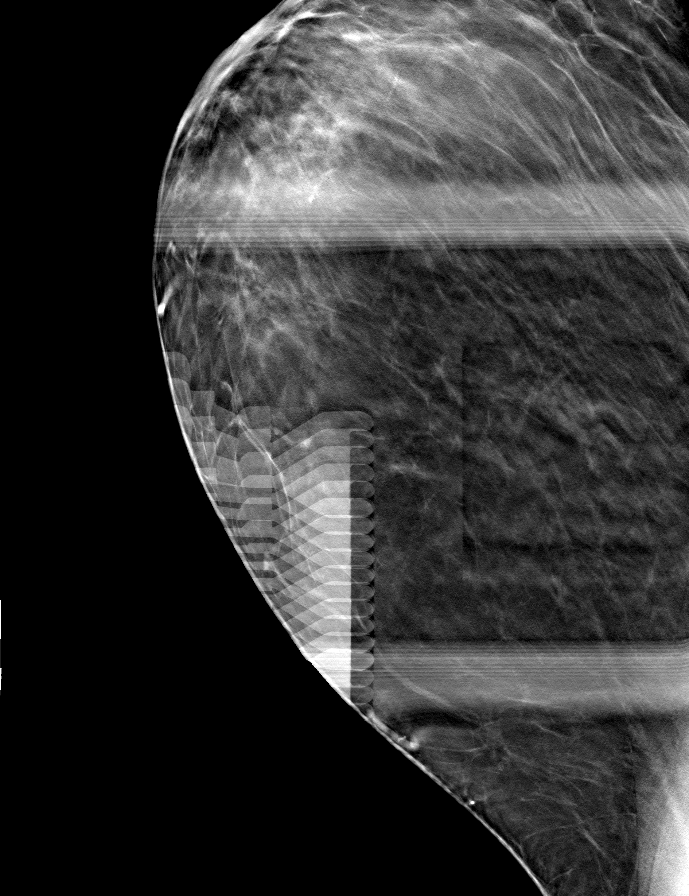

[8 of 37 positions shown; findings below may reference images not displayed]



Using sterile technique and 1% Lidocaine as local anesthetic, under
stereotactic guidance, a 9 gauge vacuum assisted device was used to
perform core needle biopsy of the targeted asymmetry over the
posterior upper outer right breast using a lateral to medial
approach. Specimen radiograph was performed.

Lesion quadrant: Right upper outer quadrant.

At the conclusion of the procedure, a ribbon shaped tissue marker
clip was deployed into the biopsy cavity. Follow-up 2-view mammogram
was performed and dictated separately.
IMPRESSION: Stereotactic-guided biopsy of an indeterminate right breast
asymmetry. No apparent complications.

ADDENDUM:
Pathology revealed FIBROCYSTIC CHANGES WITHOUT EPITHELIAL
HYPERPLASIA AND FOCAL FIBROADENOMATOID CHANGES of the RIGHT breast,
posterior upper outer, (ribbon clip). This was found to be
concordant by Dr. Javeria Cabanas.

Pathology results were discussed with the patient by telephone. The
patient reported doing well after the biopsy with tenderness at the
site. Post biopsy instructions and care were reviewed and questions
were answered. The patient was encouraged to call The [REDACTED] for any additional concerns. My direct phone
number was provided.

The patient was instructed to return for annual screening
mammography in August 2022.

Pathology results reported by Daviiana Augusto, RN on 09/15/2021.



Using sterile technique and 1% Lidocaine as local anesthetic, under
stereotactic guidance, a 9 gauge vacuum assisted device was used to
perform core needle biopsy of the targeted asymmetry over the
posterior upper outer right breast using a lateral to medial
approach. Specimen radiograph was performed.

Lesion quadrant: Right upper outer quadrant.

At the conclusion of the procedure, a ribbon shaped tissue marker
clip was deployed into the biopsy cavity. Follow-up 2-view mammogram
was performed and dictated separately.
IMPRESSION: Stereotactic-guided biopsy of an indeterminate right breast
asymmetry. No apparent complications.

## 2023-07-27 ENCOUNTER — Other Ambulatory Visit: Payer: Self-pay | Admitting: Nurse Practitioner

## 2023-07-27 DIAGNOSIS — Z0000 Encounter for general adult medical examination without abnormal findings: Secondary | ICD-10-CM

## 2023-08-23 DIAGNOSIS — L304 Erythema intertrigo: Secondary | ICD-10-CM | POA: Diagnosis not present

## 2023-08-23 DIAGNOSIS — L820 Inflamed seborrheic keratosis: Secondary | ICD-10-CM | POA: Diagnosis not present

## 2023-09-02 ENCOUNTER — Ambulatory Visit
Admission: RE | Admit: 2023-09-02 | Discharge: 2023-09-02 | Disposition: A | Discharge status: Home / Self Care | Source: Ambulatory Visit | Attending: Nurse Practitioner | Admitting: Nurse Practitioner

## 2023-09-02 DIAGNOSIS — Z1231 Encounter for screening mammogram for malignant neoplasm of breast: Secondary | ICD-10-CM | POA: Diagnosis not present

## 2023-09-02 DIAGNOSIS — Z0000 Encounter for general adult medical examination without abnormal findings: Secondary | ICD-10-CM

## 2023-09-12 ENCOUNTER — Other Ambulatory Visit: Payer: Self-pay | Admitting: Nurse Practitioner

## 2023-09-12 DIAGNOSIS — M8580 Other specified disorders of bone density and structure, unspecified site: Secondary | ICD-10-CM

## 2023-09-12 DIAGNOSIS — R21 Rash and other nonspecific skin eruption: Secondary | ICD-10-CM | POA: Diagnosis not present

## 2023-09-12 DIAGNOSIS — L304 Erythema intertrigo: Secondary | ICD-10-CM | POA: Diagnosis not present

## 2023-10-10 ENCOUNTER — Other Ambulatory Visit: Payer: Self-pay | Admitting: Nurse Practitioner

## 2023-10-10 DIAGNOSIS — Z1231 Encounter for screening mammogram for malignant neoplasm of breast: Secondary | ICD-10-CM | POA: Diagnosis not present

## 2023-10-10 DIAGNOSIS — Z0000 Encounter for general adult medical examination without abnormal findings: Secondary | ICD-10-CM

## 2023-10-10 DIAGNOSIS — E559 Vitamin D deficiency, unspecified: Secondary | ICD-10-CM | POA: Diagnosis not present

## 2023-10-10 DIAGNOSIS — N1831 Chronic kidney disease, stage 3a: Secondary | ICD-10-CM | POA: Diagnosis not present

## 2023-10-10 DIAGNOSIS — M8580 Other specified disorders of bone density and structure, unspecified site: Secondary | ICD-10-CM | POA: Diagnosis not present

## 2023-10-10 DIAGNOSIS — Z1211 Encounter for screening for malignant neoplasm of colon: Secondary | ICD-10-CM | POA: Diagnosis not present

## 2023-10-10 DIAGNOSIS — E7800 Pure hypercholesterolemia, unspecified: Secondary | ICD-10-CM | POA: Diagnosis not present

## 2023-10-10 DIAGNOSIS — D696 Thrombocytopenia, unspecified: Secondary | ICD-10-CM | POA: Diagnosis not present

## 2023-11-21 ENCOUNTER — Ambulatory Visit (HOSPITAL_BASED_OUTPATIENT_CLINIC_OR_DEPARTMENT_OTHER)
Admission: RE | Admit: 2023-11-21 | Discharge: 2023-11-21 | Disposition: A | Discharge status: Home / Self Care | Source: Ambulatory Visit | Attending: Nurse Practitioner | Admitting: Nurse Practitioner

## 2023-11-21 DIAGNOSIS — M8580 Other specified disorders of bone density and structure, unspecified site: Secondary | ICD-10-CM | POA: Diagnosis present

## 2023-11-21 DIAGNOSIS — M8589 Other specified disorders of bone density and structure, multiple sites: Secondary | ICD-10-CM | POA: Diagnosis not present

## 2023-11-21 DIAGNOSIS — Z13820 Encounter for screening for osteoporosis: Secondary | ICD-10-CM | POA: Diagnosis not present

## 2023-11-21 DIAGNOSIS — Z780 Asymptomatic menopausal state: Secondary | ICD-10-CM | POA: Diagnosis not present

## 2024-01-18 DIAGNOSIS — Z0000 Encounter for general adult medical examination without abnormal findings: Secondary | ICD-10-CM | POA: Diagnosis not present

## 2024-01-18 DIAGNOSIS — N183 Chronic kidney disease, stage 3 unspecified: Secondary | ICD-10-CM | POA: Diagnosis not present

## 2024-04-03 DIAGNOSIS — H35341 Macular cyst, hole, or pseudohole, right eye: Secondary | ICD-10-CM | POA: Diagnosis not present

## 2024-04-03 DIAGNOSIS — H52203 Unspecified astigmatism, bilateral: Secondary | ICD-10-CM | POA: Diagnosis not present

## 2024-04-03 DIAGNOSIS — H524 Presbyopia: Secondary | ICD-10-CM | POA: Diagnosis not present

## 2024-04-03 DIAGNOSIS — H25813 Combined forms of age-related cataract, bilateral: Secondary | ICD-10-CM | POA: Diagnosis not present

## 2024-04-03 DIAGNOSIS — H5213 Myopia, bilateral: Secondary | ICD-10-CM | POA: Diagnosis not present
